# Patient Record
Sex: Male | Born: 1995 | Hispanic: No | Marital: Single | Smoking: Never smoker
Health system: Southern US, Community
[De-identification: ages and names within clinical notes are randomized; demographics above are authoritative.]

---

## 2015-03-22 ENCOUNTER — Emergency Department (HOSPITAL_COMMUNITY): Payer: Self-pay

## 2015-03-22 ENCOUNTER — Emergency Department (HOSPITAL_COMMUNITY)
Admission: EM | Admit: 2015-03-22 | Discharge: 2015-03-22 | Disposition: A | Discharge status: Home / Self Care | Payer: Self-pay | Attending: Emergency Medicine | Admitting: Emergency Medicine

## 2015-03-22 ENCOUNTER — Encounter (HOSPITAL_COMMUNITY): Payer: Self-pay | Admitting: Emergency Medicine

## 2015-03-22 DIAGNOSIS — Y998 Other external cause status: Secondary | ICD-10-CM | POA: Insufficient documentation

## 2015-03-22 DIAGNOSIS — Y9368 Activity, volleyball (beach) (court): Secondary | ICD-10-CM | POA: Insufficient documentation

## 2015-03-22 DIAGNOSIS — X58XXXA Exposure to other specified factors, initial encounter: Secondary | ICD-10-CM | POA: Insufficient documentation

## 2015-03-22 DIAGNOSIS — S93402A Sprain of unspecified ligament of left ankle, initial encounter: Secondary | ICD-10-CM

## 2015-03-22 DIAGNOSIS — S93492A Sprain of other ligament of left ankle, initial encounter: Secondary | ICD-10-CM | POA: Insufficient documentation

## 2015-03-22 DIAGNOSIS — Y9289 Other specified places as the place of occurrence of the external cause: Secondary | ICD-10-CM | POA: Insufficient documentation

## 2015-03-22 MED ORDER — TRAMADOL HCL 50 MG PO TABS: 50.0000 mg | ORAL_TABLET | Freq: Four times a day (QID) | ORAL | Status: AC | PRN

## 2015-03-22 MED ORDER — NAPROXEN 500 MG PO TABS
500.0000 mg | ORAL_TABLET | Freq: Once | ORAL | Status: AC
  Administered 2015-03-22: 500 mg via ORAL
  Filled 2015-03-22: qty 1

## 2015-03-22 MED ORDER — TRAMADOL HCL 50 MG PO TABS
50.0000 mg | ORAL_TABLET | Freq: Once | ORAL | Status: AC
  Administered 2015-03-22: 50 mg via ORAL
  Filled 2015-03-22: qty 1

## 2015-03-22 MED ORDER — NAPROXEN 500 MG PO TABS: 500.0000 mg | ORAL_TABLET | Freq: Two times a day (BID) | ORAL | Status: AC

## 2015-03-22 NOTE — ED Notes (Signed)
Per EMS- pt was playing volleyball and rolled his left ankle over. No obvious deformities. Able to stand. Says he "heard a pop." Rates pain 8/10. BP 110/63 HR 60s SpO2 98% on RA.

## 2015-03-22 NOTE — ED Provider Notes (Signed)
CSN: [id number]     Arrival date & time 03/22/15  1918 History  This chart was scribed for non-physician practitioner, Emilia Beck, PA-C, working with Gilda Crease, MD by Freida Busman, ED Scribe. This patient was seen in room WTR9/WTR9 and the patient's care was started at 9:09 PM.    Chief Complaint  Patient presents with  . Ankle Injury    left    The history is provided by the patient. No language interpreter was used.   HPI Comments:  Gregory Burch is a 19 y.o. male who presents to the Emergency Department complaining of throbbing constant left ankle pain. Pt rolled the ankle while playing volleyball PTA. He has no other injuries/complaints. No alleviating factors noted. Weight bearing makes the pain worse. No alleviating factors.   History reviewed. No pertinent past medical history. History reviewed. No pertinent past surgical history. History reviewed. No pertinent family history. Social History  Substance Use Topics  . Smoking status: Never Smoker   . Smokeless tobacco: None  . Alcohol Use: No    Review of Systems  Musculoskeletal: Positive for myalgias and arthralgias.  All other systems reviewed and are negative.     Allergies  Review of patient's allergies indicates no known allergies.  Home Medications   Prior to Admission medications   Not on File   BP 118/46 mmHg  Pulse 56  Temp(Src) 98.6 F (37 C) (Oral)  Resp 20  SpO2 99% Physical Exam  Constitutional: He is oriented to person, place, and time. He appears well-developed and well-nourished. No distress.  HENT:  Head: Normocephalic and atraumatic.  Eyes: Conjunctivae are normal.  Cardiovascular: Normal rate.   Pulmonary/Chest: Effort normal.  Abdominal: He exhibits no distension.  Musculoskeletal: He exhibits edema and tenderness.  generalized edema of the left ankle with no obvious deformity Severely limited ROM due to pain and swelling TTP of the left lateral malleolus Pt  able to wiggle toes  Neurological: He is alert and oriented to person, place, and time.  Skin: Skin is warm and dry.  Psychiatric: He has a normal mood and affect.  Nursing note and vitals reviewed.   ED Course  Procedures (including critical care time) Labs Review Labs Reviewed - No data to display  Imaging Review Dg Tibia/fibula Left  03/22/2015   CLINICAL DATA:  Swelling and pain of the left ankle status post rolling injury.  EXAM: LEFT FOOT - COMPLETE 3+ VIEW; LEFT TIBIA AND FIBULA - 2 VIEW; LEFT ANKLE COMPLETE - 3+ VIEW  COMPARISON:  None.  FINDINGS: No evidence of acute fracture or subluxation. No focal bone lesion or bone destruction. Bone cortex and trabecular architecture appear intact. No radiopaque soft tissue foreign bodies. There is soft tissue swelling about the lateral malleolus.  IMPRESSION: No acute fracture or dislocation identified about the left foot, ankle or lower extremity.  Soft tissue swelling about the lateral malleolus.   Electronically Signed   By: Ted Mcalpine M.D.   On: 03/22/2015 20:16   Dg Ankle Complete Left  03/22/2015   CLINICAL DATA:  Swelling and pain of the left ankle status post rolling injury.  EXAM: LEFT FOOT - COMPLETE 3+ VIEW; LEFT TIBIA AND FIBULA - 2 VIEW; LEFT ANKLE COMPLETE - 3+ VIEW  COMPARISON:  None.  FINDINGS: No evidence of acute fracture or subluxation. No focal bone lesion or bone destruction. Bone cortex and trabecular architecture appear intact. No radiopaque soft tissue foreign bodies. There is soft tissue swelling about the lateral  malleolus.  IMPRESSION: No acute fracture or dislocation identified about the left foot, ankle or lower extremity.  Soft tissue swelling about the lateral malleolus.   Electronically Signed   By: Ted Mcalpine M.D.   On: 03/22/2015 20:16   Dg Foot Complete Left  03/22/2015   CLINICAL DATA:  Swelling and pain of the left ankle status post rolling injury.  EXAM: LEFT FOOT - COMPLETE 3+ VIEW; LEFT  TIBIA AND FIBULA - 2 VIEW; LEFT ANKLE COMPLETE - 3+ VIEW  COMPARISON:  None.  FINDINGS: No evidence of acute fracture or subluxation. No focal bone lesion or bone destruction. Bone cortex and trabecular architecture appear intact. No radiopaque soft tissue foreign bodies. There is soft tissue swelling about the lateral malleolus.  IMPRESSION: No acute fracture or dislocation identified about the left foot, ankle or lower extremity.  Soft tissue swelling about the lateral malleolus.   Electronically Signed   By: Ted Mcalpine M.D.   On: 03/22/2015 20:16   I have personally reviewed and evaluated these images and lab results as part of my medical decision-making.   EKG Interpretation None      MDM   Final diagnoses:  Left ankle sprain, initial encounter    Xray shows no acute changes. Patient with a left ankle sprain. Patient will have ASO brace and crutches. No other injury.   I personally performed the services described in this documentation, which was scribed in my presence. The recorded information has been reviewed and is accurate.    Emilia Beck, PA-C 03/23/15 0036  Gilda Crease, MD 03/23/15 2312

## 2015-03-22 NOTE — Discharge Instructions (Signed)
Take naprosyn and tramadol as needed for pain. You may take these medications together. Rest, ice, and elevate your ankle for pain and swelling relief.

## 2016-06-06 IMAGING — CR DG ANKLE COMPLETE 3+V*L*
3 series · 3 of 3 positions shown · non-contrast
Comparison: None.

CLINICAL DATA: Swelling and pain of the left ankle status post
rolling injury.

EXAM:
LEFT FOOT - COMPLETE 3+ VIEW; LEFT TIBIA AND FIBULA - 2 VIEW; LEFT
ANKLE COMPLETE - 3+ VIEW

[t ankle ap left]
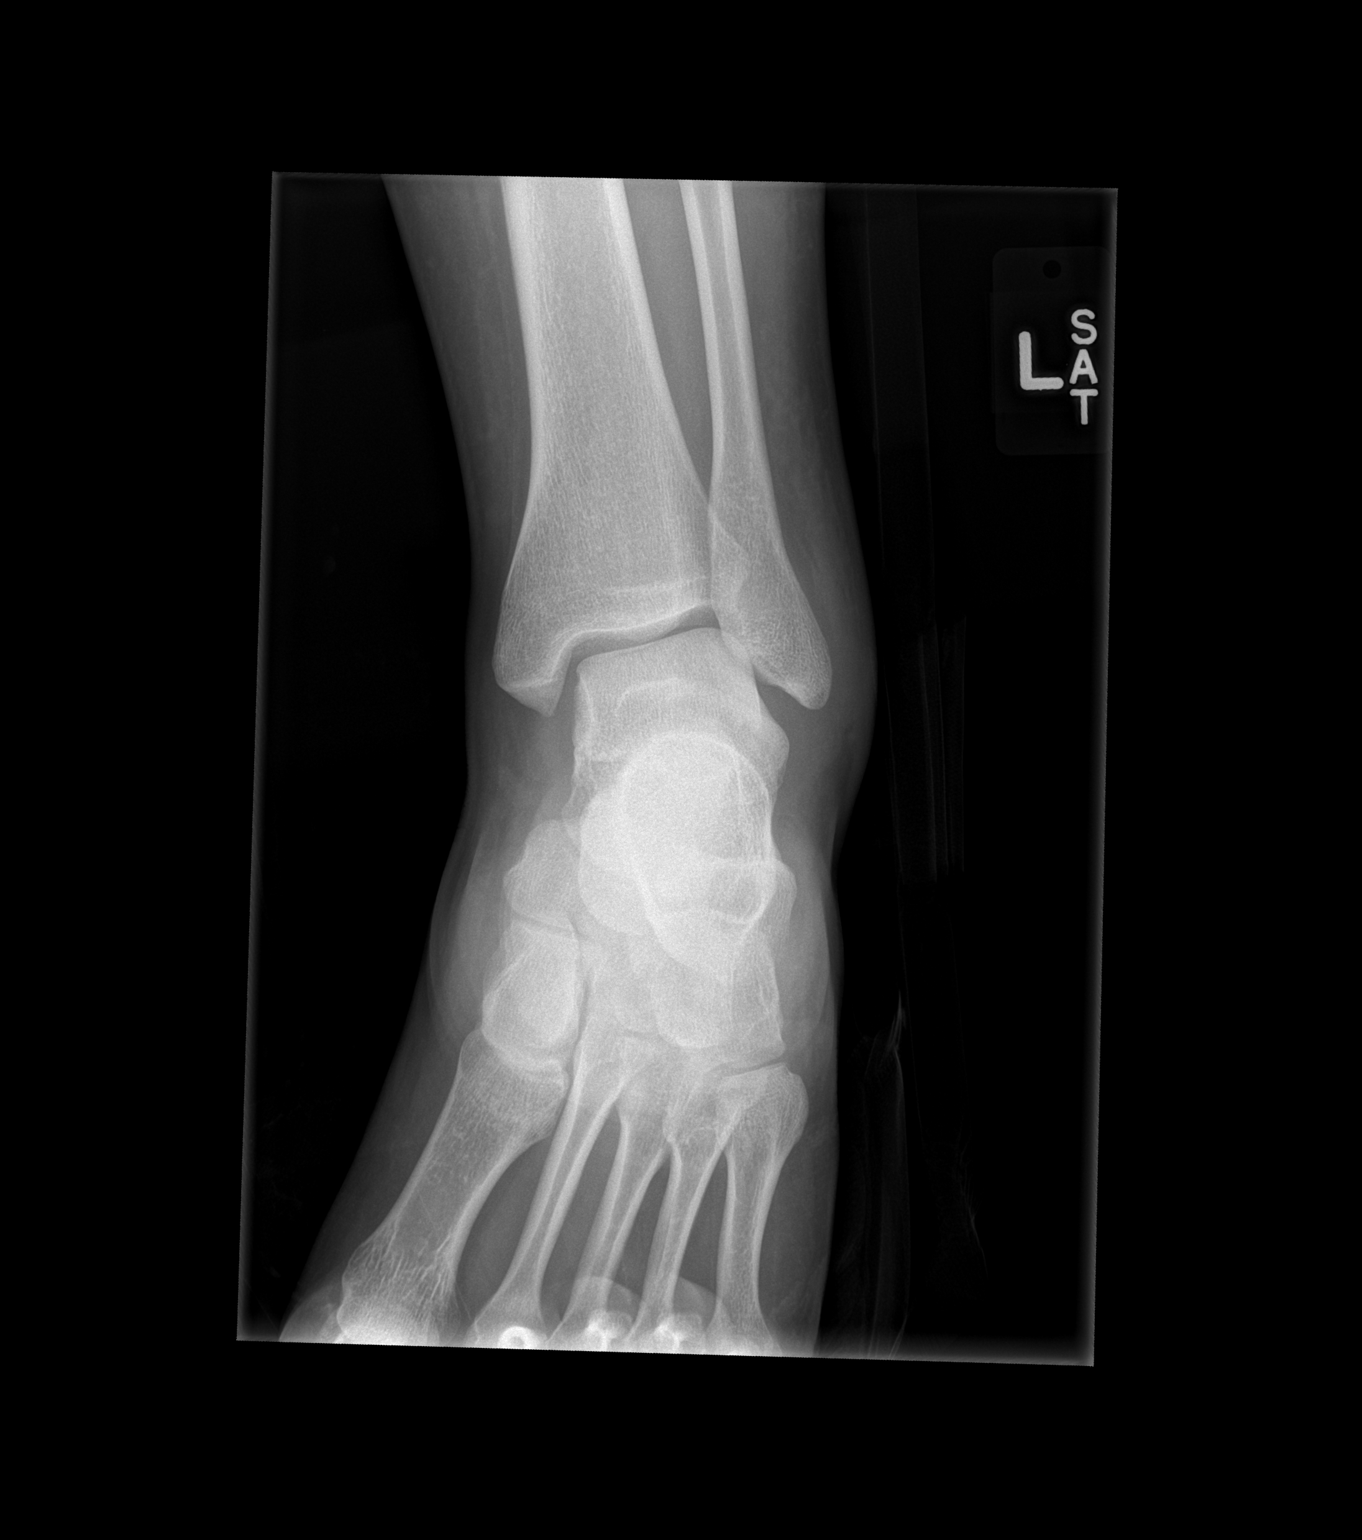

[t ankle obl left]
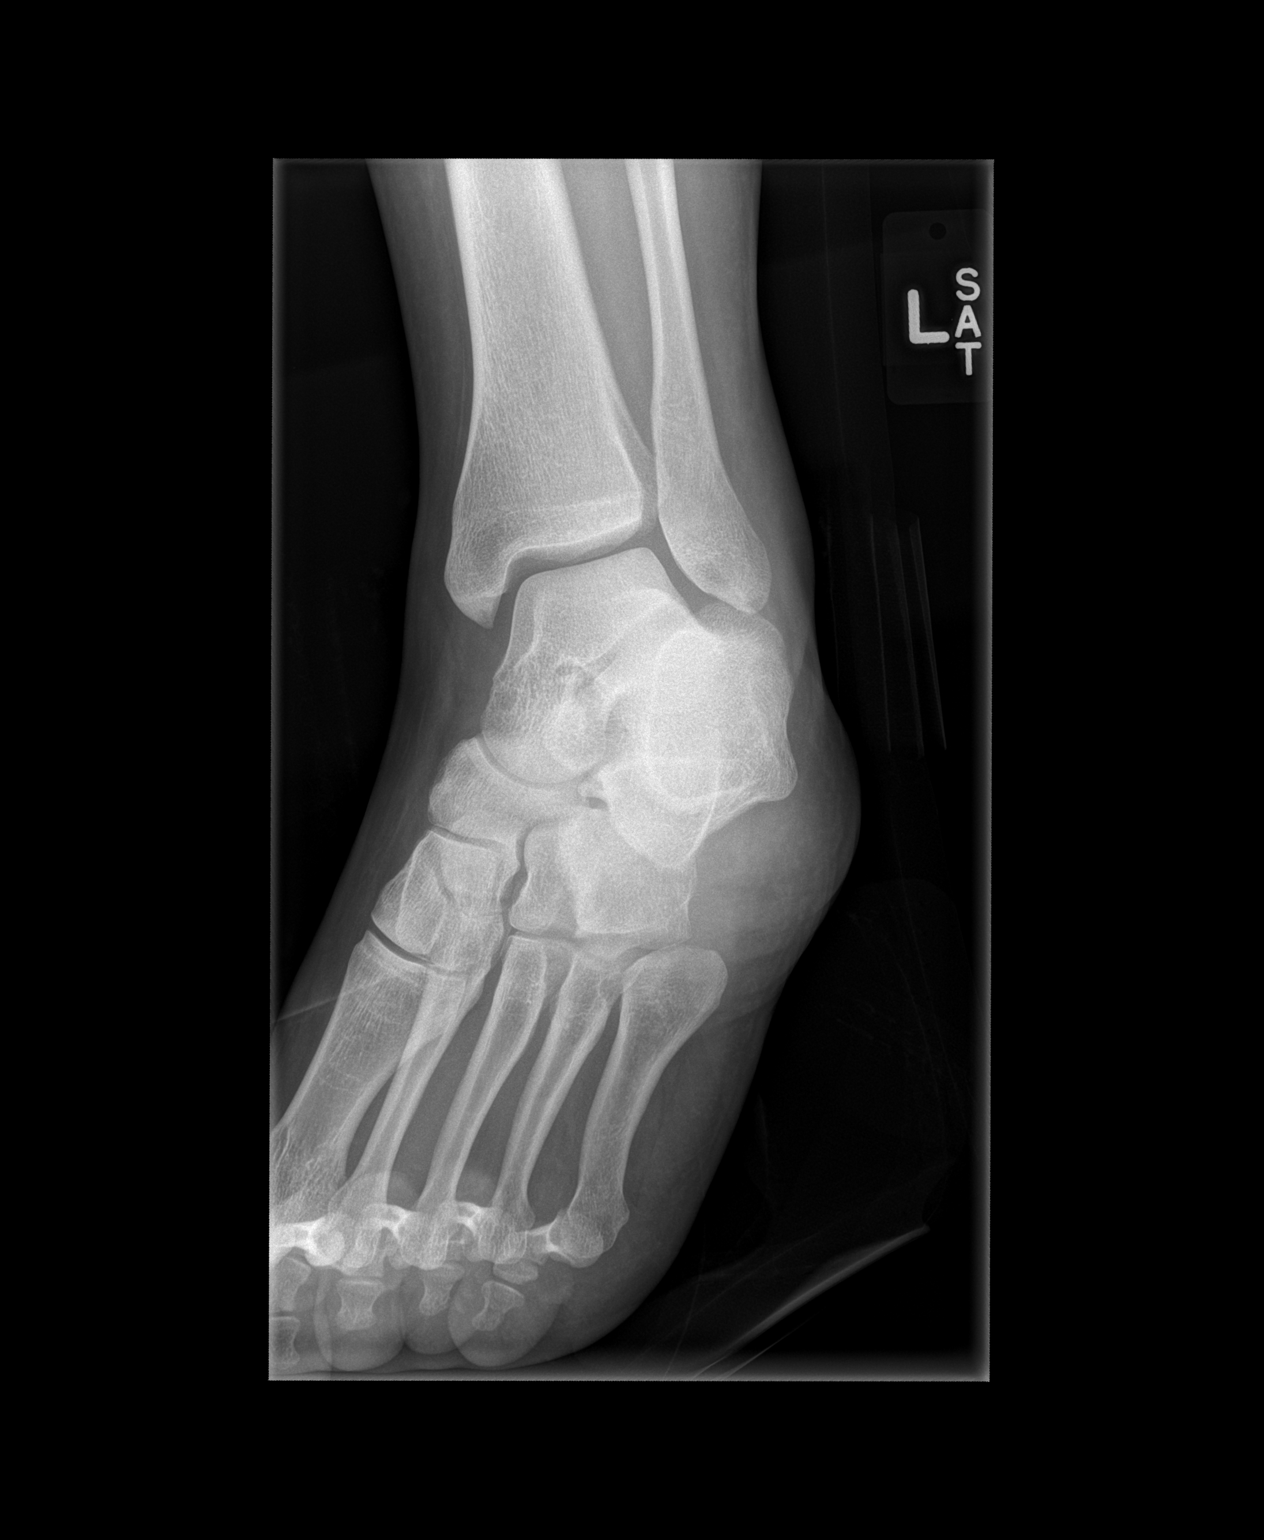

[t ankle lat left]
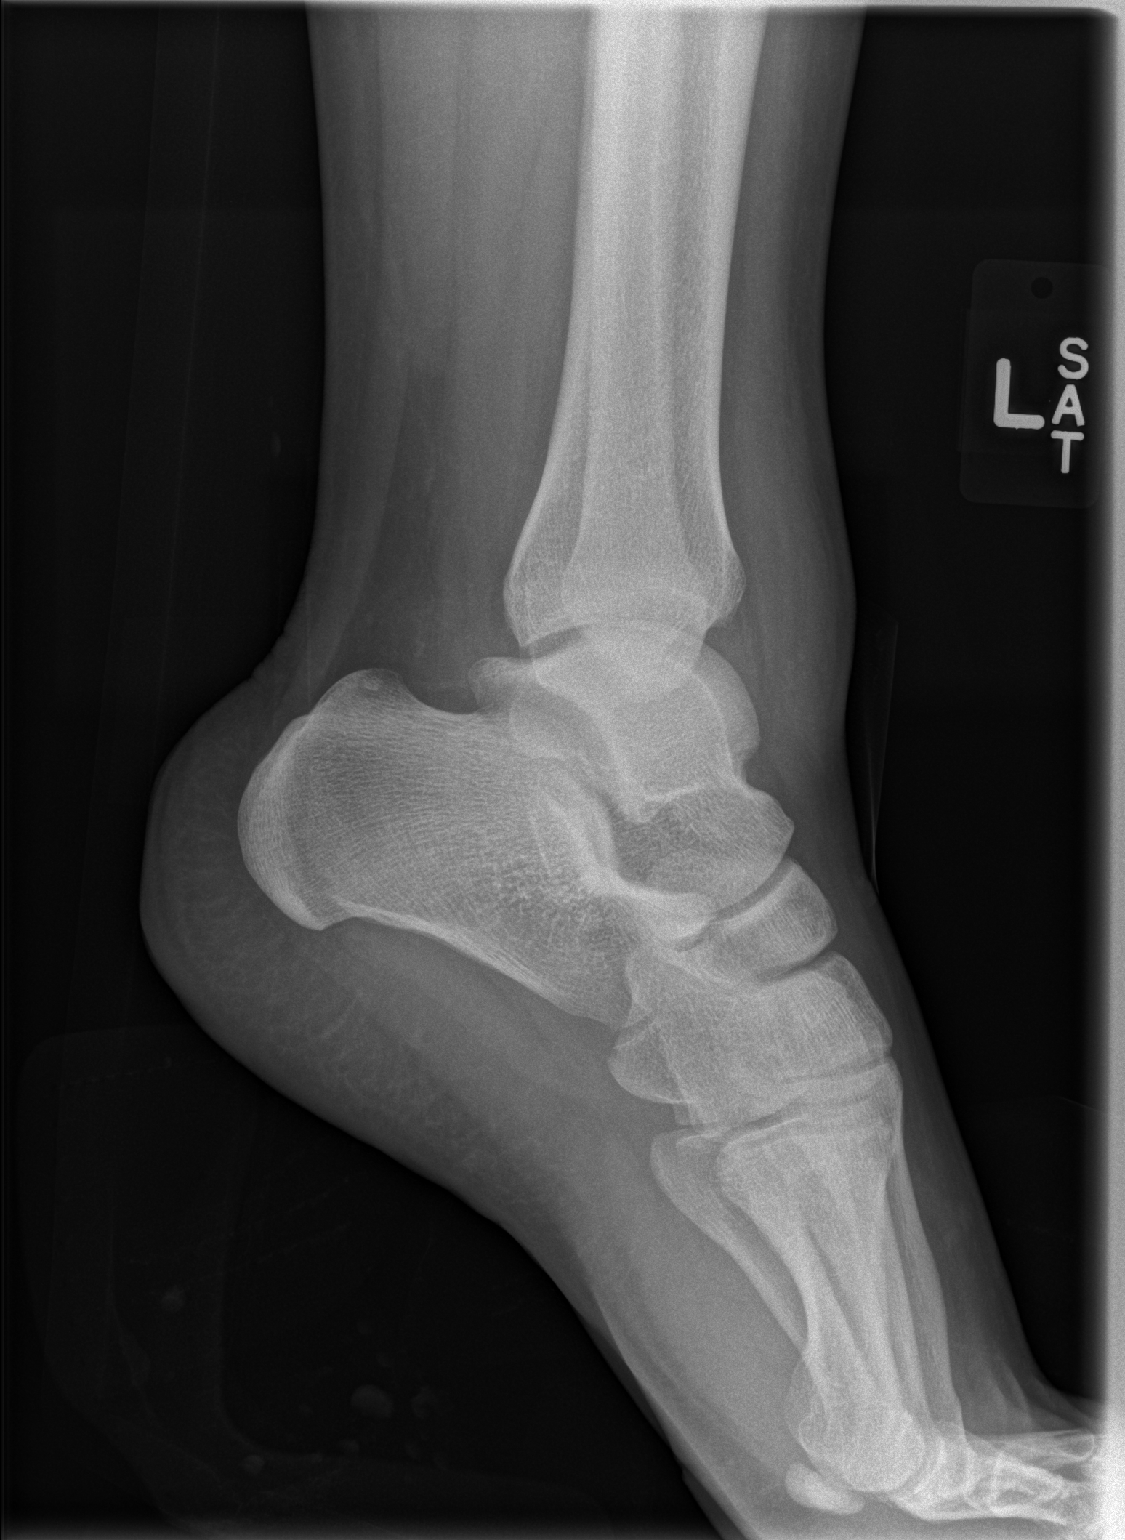

[3 of 3 positions shown; findings below may reference images not displayed]

FINDINGS: No evidence of acute fracture or subluxation. No focal bone lesion
or bone destruction. Bone cortex and trabecular architecture appear
intact. No radiopaque soft tissue foreign bodies. There is soft
tissue swelling about the lateral malleolus.
IMPRESSION: No acute fracture or dislocation identified about the left foot,
ankle or lower extremity.

Soft tissue swelling about the lateral malleolus.
